# Patient Record
Sex: Male | Born: 1980 | Race: White | Hispanic: No | Marital: Married | Smoking: Never smoker
Health system: Southern US, Community
[De-identification: ages and names within clinical notes are randomized; demographics above are authoritative.]

---

## 2014-04-20 ENCOUNTER — Encounter (HOSPITAL_BASED_OUTPATIENT_CLINIC_OR_DEPARTMENT_OTHER): Payer: Self-pay | Admitting: Emergency Medicine

## 2014-04-20 ENCOUNTER — Emergency Department (HOSPITAL_BASED_OUTPATIENT_CLINIC_OR_DEPARTMENT_OTHER)
Admission: EM | Admit: 2014-04-20 | Discharge: 2014-04-20 | Disposition: A | Discharge status: Home / Self Care | Payer: BC Managed Care – PPO | Attending: Emergency Medicine | Admitting: Emergency Medicine

## 2014-04-20 ENCOUNTER — Emergency Department (HOSPITAL_BASED_OUTPATIENT_CLINIC_OR_DEPARTMENT_OTHER): Payer: BC Managed Care – PPO

## 2014-04-20 DIAGNOSIS — Z880 Allergy status to penicillin: Secondary | ICD-10-CM | POA: Insufficient documentation

## 2014-04-20 DIAGNOSIS — R61 Generalized hyperhidrosis: Secondary | ICD-10-CM | POA: Diagnosis not present

## 2014-04-20 DIAGNOSIS — R109 Unspecified abdominal pain: Secondary | ICD-10-CM | POA: Diagnosis present

## 2014-04-20 DIAGNOSIS — N200 Calculus of kidney: Secondary | ICD-10-CM | POA: Diagnosis not present

## 2014-04-20 LAB — URINALYSIS, ROUTINE W REFLEX MICROSCOPIC
Bilirubin Urine: NEGATIVE
Glucose, UA: NEGATIVE mg/dL
KETONES UR: NEGATIVE mg/dL
Leukocytes, UA: NEGATIVE
Nitrite: NEGATIVE
Protein, ur: NEGATIVE mg/dL
SPECIFIC GRAVITY, URINE: 1.02 (ref 1.005–1.030)
UROBILINOGEN UA: 0.2 mg/dL (ref 0.0–1.0)
pH: 5.5 (ref 5.0–8.0)

## 2014-04-20 LAB — COMPREHENSIVE METABOLIC PANEL
ALK PHOS: 51 U/L (ref 39–117)
ALT: 14 U/L (ref 0–53)
AST: 16 U/L (ref 0–37)
Albumin: 4.5 g/dL (ref 3.5–5.2)
Anion gap: 13 (ref 5–15)
BILIRUBIN TOTAL: 0.5 mg/dL (ref 0.3–1.2)
BUN: 14 mg/dL (ref 6–23)
CHLORIDE: 100 meq/L (ref 96–112)
CO2: 25 meq/L (ref 19–32)
Calcium: 10.1 mg/dL (ref 8.4–10.5)
Creatinine, Ser: 1 mg/dL (ref 0.50–1.35)
GFR calc non Af Amer: >90 mL/min (ref >90)
GLUCOSE: 106 mg/dL — AB (ref 70–99)
POTASSIUM: 4 meq/L (ref 3.7–5.3)
Sodium: 138 mEq/L (ref 137–147)
Total Protein: 7.6 g/dL (ref 6.0–8.3)

## 2014-04-20 LAB — URINE MICROSCOPIC-ADD ON

## 2014-04-20 LAB — CBC WITH DIFFERENTIAL/PLATELET
Basophils Absolute: 0 10*3/uL (ref 0.0–0.1)
Basophils Relative: 0 % (ref 0–1)
Eosinophils Absolute: 0 10*3/uL (ref 0.0–0.7)
Eosinophils Relative: 0 % (ref 0–5)
HCT: 36.8 % — ABNORMAL LOW (ref 39.0–52.0)
HEMOGLOBIN: 12.8 g/dL — AB (ref 13.0–17.0)
LYMPHS ABS: 1.7 10*3/uL (ref 0.7–4.0)
Lymphocytes Relative: 19 % (ref 12–46)
MCH: 31.3 pg (ref 26.0–34.0)
MCHC: 34.8 g/dL (ref 30.0–36.0)
MCV: 90 fL (ref 78.0–100.0)
MONOS PCT: 5 % (ref 3–12)
Monocytes Absolute: 0.5 10*3/uL (ref 0.1–1.0)
NEUTROS ABS: 6.7 10*3/uL (ref 1.7–7.7)
Neutrophils Relative %: 75 % (ref 43–77)
Platelets: 277 10*3/uL (ref 150–400)
RBC: 4.09 MIL/uL — AB (ref 4.22–5.81)
RDW: 12.9 % (ref 11.5–15.5)
WBC: 9 10*3/uL (ref 4.0–10.5)

## 2014-04-20 LAB — LIPASE, BLOOD: Lipase: 31 U/L (ref 11–59)

## 2014-04-20 MED ORDER — HYDROMORPHONE HCL PF 1 MG/ML IJ SOLN
0.5000 mg | Freq: Once | INTRAMUSCULAR | Status: AC
  Administered 2014-04-20: 0.5 mg via INTRAVENOUS
  Filled 2014-04-20: qty 1

## 2014-04-20 MED ORDER — SODIUM CHLORIDE 0.9 % IV SOLN
1000.0000 mL | INTRAVENOUS | Status: DC
  Administered 2014-04-20: 1000 mL via INTRAVENOUS

## 2014-04-20 MED ORDER — HYDROCODONE-ACETAMINOPHEN 5-325 MG PO TABS: 1.0000 | ORAL_TABLET | Freq: Four times a day (QID) | ORAL | Status: DC | PRN

## 2014-04-20 MED ORDER — TAMSULOSIN HCL 0.4 MG PO CAPS: 0.4000 mg | ORAL_CAPSULE | Freq: Every day | ORAL | Status: DC

## 2014-04-20 NOTE — ED Provider Notes (Signed)
CSN: [id number]     Arrival date & time 04/20/14  1555 History  This chart was scribed for Gerhard Munch, MD by Chestine Spore, ED Scribe. The patient was seen in room MH02/MH02 at 4:44 PM.     Chief Complaint  Patient presents with  . Abdominal Pain     The history is provided by the patient. No language interpreter was used.   HPI Comments: Hayden Sexton is a 33 y.o. male who presents to the Emergency Department complaining of abdominal pain onset today.  He states that the pain is in his RLQ and radiates to his back.He states that it was constant pressure pain always in the same area. He states that it started as a gas bubble that was not relieved with a BM. He states that PTA the pain had subsided. He states that while on the way to the ED he was breaking out in a sweat because of the pain. He states that it feels like he has to use the bathroom, but he knows that he doesn't. He states that he is having associated symptoms of diaphoresis, urgency. He states that he has tried Motrin with no relief for his symptoms. He denies vomiting, diarrhea, dysuria, nausea. He states that he is generally healthy. He states that he is allergic to Penicillin. He denies ever having a kidney stone.   History reviewed. No pertinent past medical history. History reviewed. No pertinent past surgical history. No family history on file. History  Substance Use Topics  . Smoking status: Never Smoker   . Smokeless tobacco: Not on file  . Alcohol Use: No    Review of Systems  Constitutional: Positive for diaphoresis.       Per HPI, otherwise negative  HENT:       Per HPI, otherwise negative  Respiratory:       Per HPI, otherwise negative  Cardiovascular:       Per HPI, otherwise negative  Gastrointestinal: Positive for abdominal pain. Negative for nausea, vomiting and diarrhea.  Endocrine:       Negative aside from HPI  Genitourinary: Positive for urgency. Negative for dysuria.       Neg aside from  HPI   Musculoskeletal:       Per HPI, otherwise negative  Skin: Negative.   Neurological: Negative for syncope.      Allergies  Penicillins  Home Medications   Prior to Admission medications   Not on File   BP 136/78  Pulse 63  Temp(Src) 97.8 F (36.6 C) (Oral)  Resp 18  Ht 6' (1.829 m)  Wt 180 lb (81.647 kg)  BMI 24.41 kg/m2  SpO2 100%  Physical Exam  Nursing note and vitals reviewed. Constitutional: He is oriented to person, place, and time. He appears well-developed. No distress.  HENT:  Head: Normocephalic and atraumatic.  Eyes: Conjunctivae and EOM are normal.  Cardiovascular: Normal rate, regular rhythm and normal heart sounds.   Pulmonary/Chest: Effort normal and breath sounds normal. No stridor. No respiratory distress.  Abdominal: Soft. He exhibits no distension.  Pain was in the RLQ but is not present upon exam. Mid discomfort.   Musculoskeletal: He exhibits no edema.  Neurological: He is alert and oriented to person, place, and time.  Skin: Skin is warm and dry.  Psychiatric: He has a normal mood and affect.    ED Course  Procedures (including critical care time) DIAGNOSTIC STUDIES: Oxygen Saturation is 100% on room air, normal by my interpretation.  COORDINATION OF CARE: 4:48 PM-Discussed treatment plan which includes UA, CT scan of abdomen pelvis, and Dilaudid with pt at bedside and pt agreed to plan.   Labs Review Labs Reviewed  URINALYSIS, ROUTINE W REFLEX MICROSCOPIC - Abnormal; Notable for the following:    Hgb urine dipstick MODERATE (*)    All other components within normal limits  CBC WITH DIFFERENTIAL - Abnormal; Notable for the following:    RBC 4.09 (*)    Hemoglobin 12.8 (*)    HCT 36.8 (*)    All other components within normal limits  COMPREHENSIVE METABOLIC PANEL - Abnormal; Notable for the following:    Glucose, Bld 106 (*)    All other components within normal limits  URINE MICROSCOPIC-ADD ON  LIPASE, BLOOD    Imaging  Review Ct Renal Stone Study  04/20/2014   CLINICAL DATA:  Pain and dysuria  EXAM: CT ABDOMEN AND PELVIS  TECHNIQUE: Multidetector CT imaging of the abdomen and pelvis was performed following the standard protocol without oral or intravenous contrast material administration.  COMPARISON:  None.  FINDINGS: Lung bases are clear.  No focal liver lesions are identified. There is no appreciable gallbladder wall thickening. There is no biliary duct dilatation.  Spleen, pancreas, and adrenals appear normal.  There is minimal fullness of the right renal collecting system. There is no similar fullness on the left. There is no renal mass or calculus on either side. There is no ureteral calculus or ureterectasis on either side. There is, however, a 1 mm calculus in the posterior urinary bladder just to the right of midline.  In the pelvis, the urinary bladder is largely decompressed. There is no pelvic mass or fluid collection. There are multiple phleboliths in the pelvis. There is fat in the right inguinal ring.  There is no bowel obstruction. No free air or portal venous air. Appendix is measures 7 mm in thickness, upper normal. There is no surrounding appendiceal inflammation or wall thickening. There is no periappendiceal region abscess or surrounding mesenteric stranding.  There is no ascites, adenopathy, or abscess in the abdomen or pelvis. There is no evidence abdominal aortic aneurysm. There are no blastic or lytic bone lesions.  IMPRESSION: Minimal fullness of the right renal collecting system. No renal or ureteral calculi are identified. There is, however, a 1 mm calculus just to the right of midline in the posterior urinary bladder. Suspect recent calculus passage on the right.  Appendix is upper1 normal in size at 7 mm diameter. Appendix otherwise appears normal.  No bowel obstruction.  No abscess.   Electronically Signed   By: Bretta BangWilliam  Woodruff M.D.   On: 04/20/2014 17:55    On repeat examination is in no  distress.  Discussed all findings, return precautions, follow up instructions.  MDM   Final diagnoses:  Kidney stone    Patient presents with right-sided abdominal pain, concern for a appendicitis versus kidney stone. Patient's pain resolved her.  Patient's CT scan demonstrates kidney stone.  Patient remained hemodynamically stable, in no distress, was discharged in stable condition to followup in neurology.  I personally performed the services described in this documentation, which was scribed in my presence. The recorded information has been reviewed and is accurate.    Gerhard Munchobert Yenesis Even, MD 04/20/14 [id number]

## 2014-04-20 NOTE — ED Notes (Signed)
Pt states he had a sharp pain to RLQ. Now he feels like he has pressure to urinate.

## 2014-04-20 NOTE — ED Notes (Signed)
PT discharged to home with family. NAD. Wife driving.

## 2014-04-20 NOTE — Discharge Instructions (Signed)
As discussed, today's evaluation has demonstrated the presence of a kidney stone.  This likely cause your abdominal pain.  Please be sure follow up with our urologists for further evaluation and management.  If you develop new, or concerning changes in your condition, please be sure to return here for further evaluation and management.

## 2014-04-20 NOTE — ED Notes (Signed)
Pt c/o RLQ radiating to back. Urgency no dysuria. Denies nausea and vomiting.

## 2016-01-03 IMAGING — CT CT RENAL STONE PROTOCOL
2 of 4 series · 16 of 46 positions shown, 18 images · non-contrast
Comparison: None.

CLINICAL DATA: Pain and dysuria

EXAM:
CT ABDOMEN AND PELVIS
TECHNIQUE: Multidetector CT imaging of the abdomen and pelvis was performed
following the standard protocol without oral or intravenous contrast
material administration.

[Series 2: renal stone < 200 lbs 5.0 b31f · axial · 0.79mm/px · z∈[-350,+100]mm · 13 of 98 slices shown, 15 images]
[im 4/98  soft-tissue]
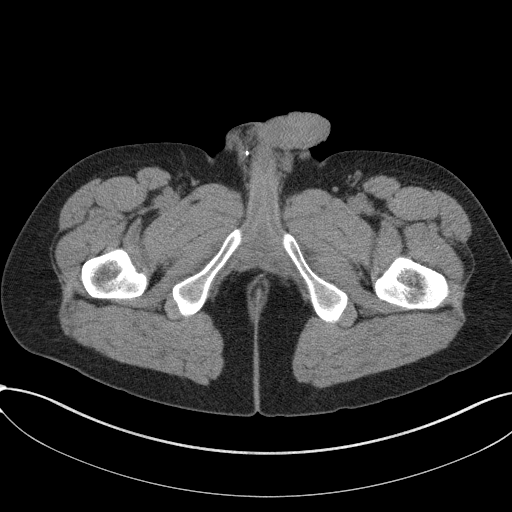
[im 4/98  bone]
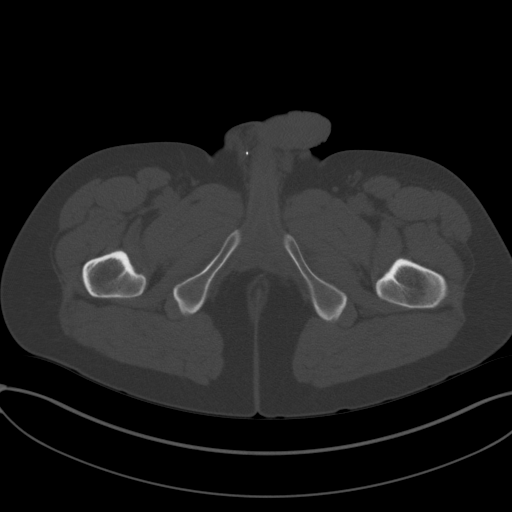
[im 12/98  soft-tissue]
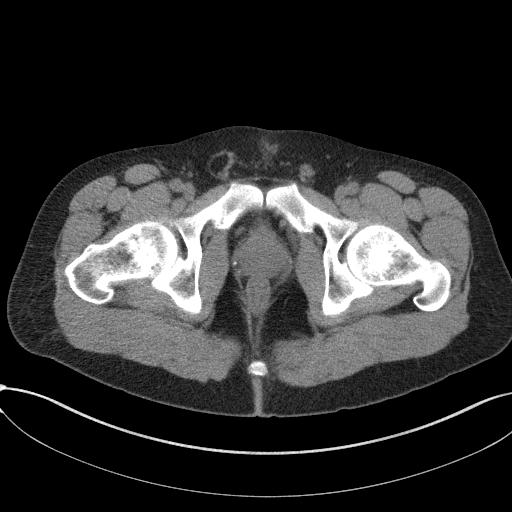
[im 20/98  soft-tissue]
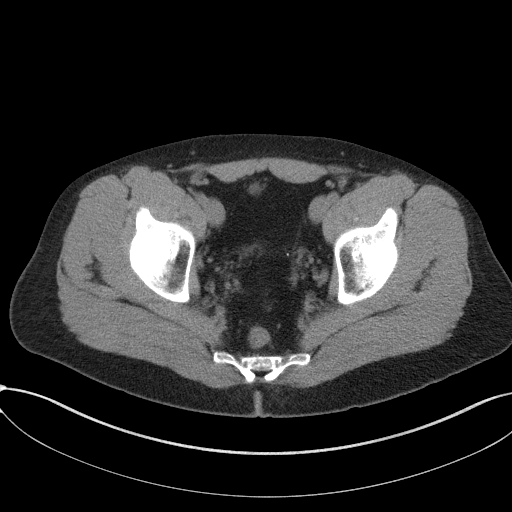
[im 28/98  soft-tissue]
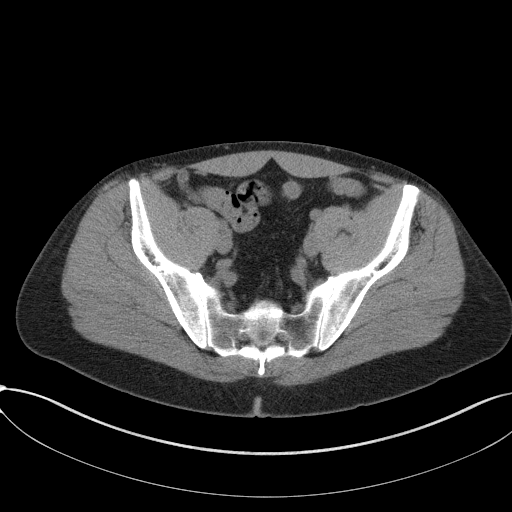
[im 35/98  soft-tissue]
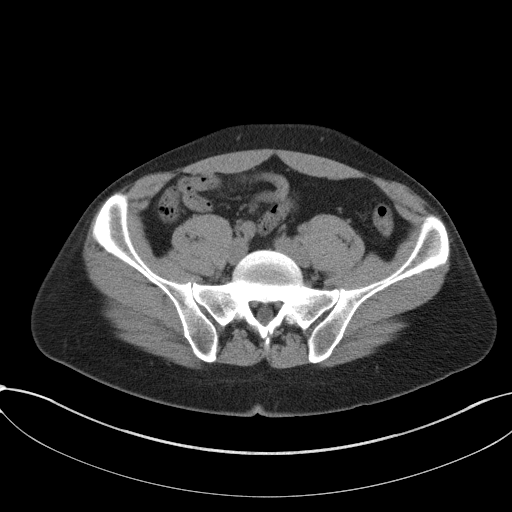
[im 43/98  soft-tissue]
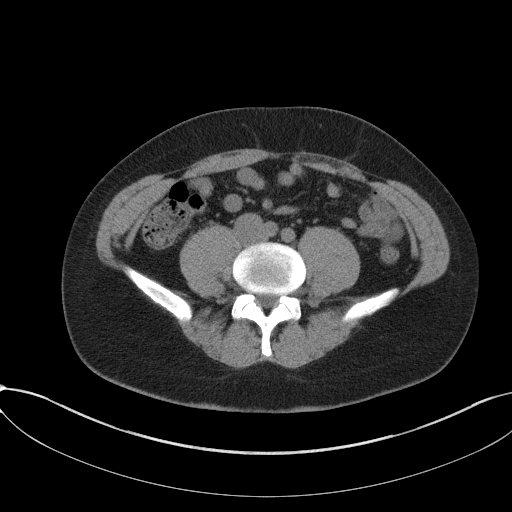
[im 51/98  soft-tissue]
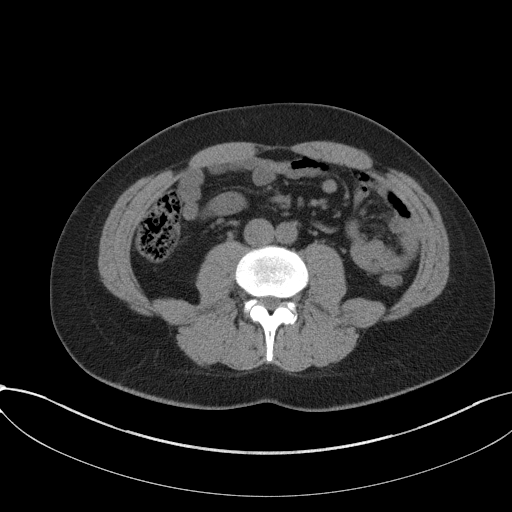
[im 55/98  soft-tissue]
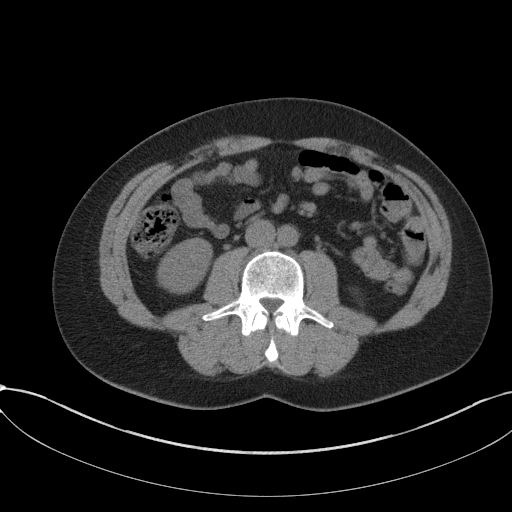
[im 63/98  soft-tissue]
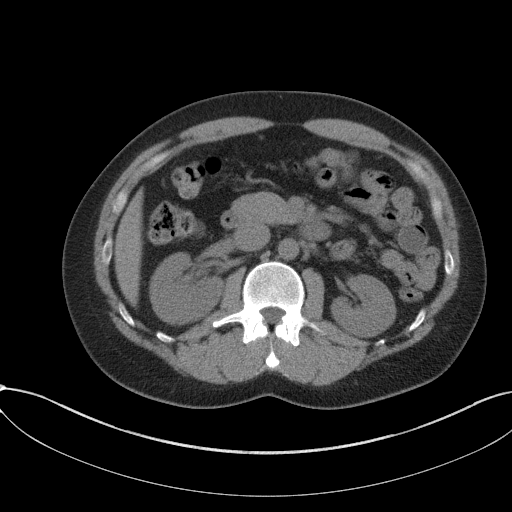
[im 63/98  bone]
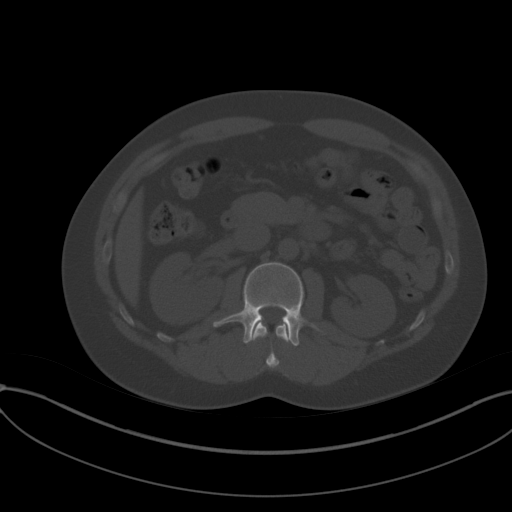
[im 70/98  soft-tissue]
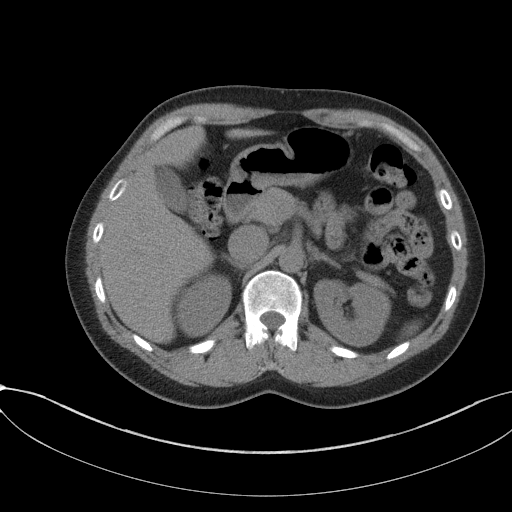
[im 78/98  soft-tissue]
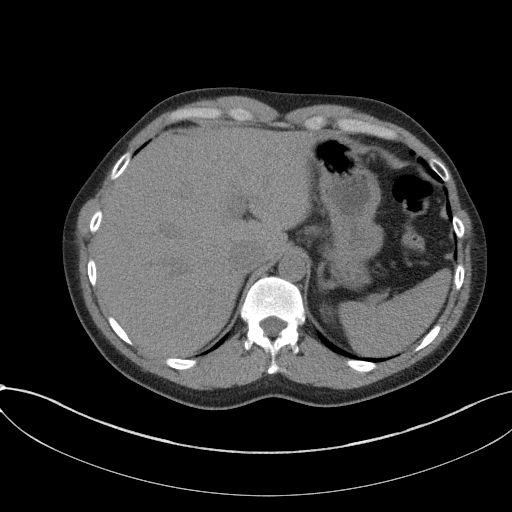
[im 86/98  soft-tissue]
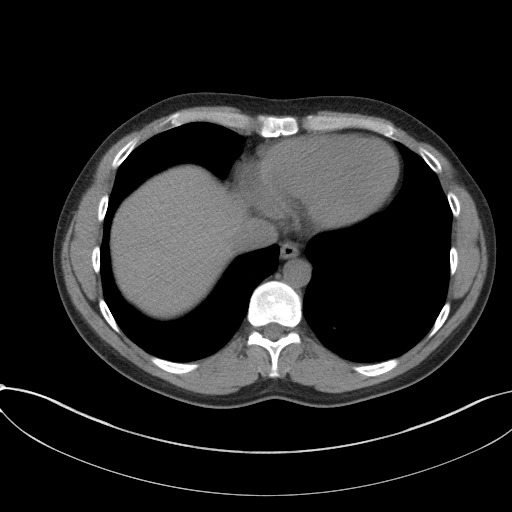
[im 94/98  soft-tissue]
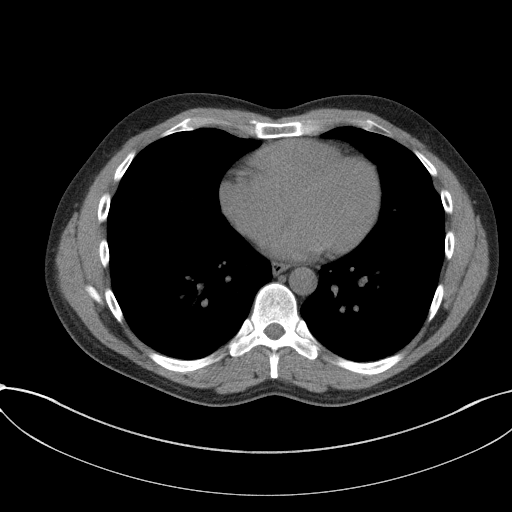

[Series 5: renal stone 3.0 coronal · coronal · 0.93mm/px · 3 of 83 slices shown]
[im 28/83  soft-tissue]
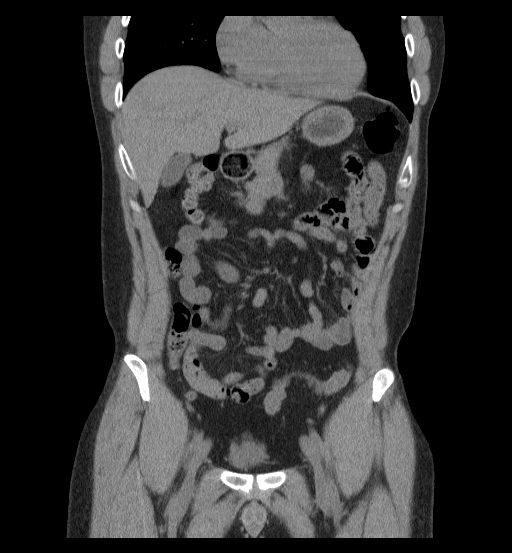
[im 37/83  soft-tissue]
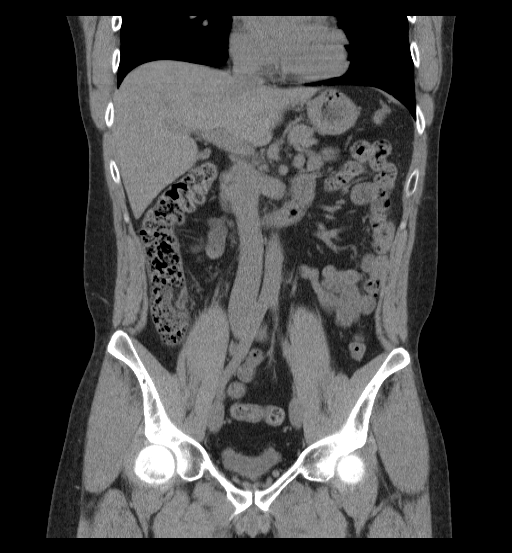
[im 46/83  soft-tissue]
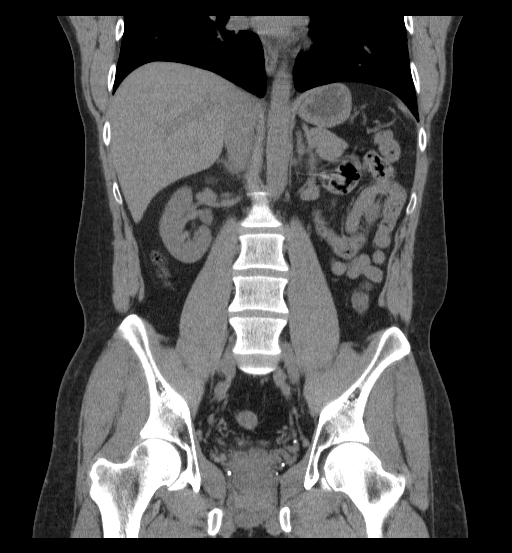

[16 of 46 positions shown; findings below may reference images not displayed]

FINDINGS: Lung bases are clear.

No focal liver lesions are identified. There is no appreciable
gallbladder wall thickening. There is no biliary duct dilatation.

Spleen, pancreas, and adrenals appear normal.

There is minimal fullness of the right renal collecting system.
There is no similar fullness on the left. There is no renal mass or
calculus on either side. There is no ureteral calculus or
ureterectasis on either side. There is, however, a 1 mm calculus in
the posterior urinary bladder just to the right of midline.

In the pelvis, the urinary bladder is largely decompressed. There is
no pelvic mass or fluid collection. There are multiple phleboliths
in the pelvis. There is fat in the right inguinal ring.

There is no bowel obstruction. No free air or portal venous air.
Appendix is measures 7 mm in thickness, upper normal. There is no
surrounding appendiceal inflammation or wall thickening. There is no
periappendiceal region abscess or surrounding mesenteric stranding.

There is no ascites, adenopathy, or abscess in the abdomen or
pelvis. There is no evidence abdominal aortic aneurysm. There are no
blastic or lytic bone lesions.
IMPRESSION: Minimal fullness of the right renal collecting system. No renal or
ureteral calculi are identified. There is, however, a 1 mm calculus
just to the right of midline in the posterior urinary bladder.
Suspect recent calculus passage on the right.

Appendix is upper1 normal in size at 7 mm diameter. Appendix
otherwise appears normal.

No bowel obstruction.  No abscess.

## 2018-09-23 ENCOUNTER — Emergency Department (HOSPITAL_BASED_OUTPATIENT_CLINIC_OR_DEPARTMENT_OTHER)
Admission: EM | Admit: 2018-09-23 | Discharge: 2018-09-23 | Disposition: A | Discharge status: Home / Self Care | Payer: BC Managed Care – PPO | Attending: Emergency Medicine | Admitting: Emergency Medicine

## 2018-09-23 ENCOUNTER — Encounter (HOSPITAL_BASED_OUTPATIENT_CLINIC_OR_DEPARTMENT_OTHER): Payer: Self-pay | Admitting: Emergency Medicine

## 2018-09-23 ENCOUNTER — Other Ambulatory Visit: Payer: Self-pay

## 2018-09-23 DIAGNOSIS — X58XXXA Exposure to other specified factors, initial encounter: Secondary | ICD-10-CM | POA: Insufficient documentation

## 2018-09-23 DIAGNOSIS — T1501XA Foreign body in cornea, right eye, initial encounter: Secondary | ICD-10-CM | POA: Diagnosis not present

## 2018-09-23 DIAGNOSIS — Y9389 Activity, other specified: Secondary | ICD-10-CM | POA: Insufficient documentation

## 2018-09-23 DIAGNOSIS — Y999 Unspecified external cause status: Secondary | ICD-10-CM | POA: Insufficient documentation

## 2018-09-23 DIAGNOSIS — Y929 Unspecified place or not applicable: Secondary | ICD-10-CM | POA: Insufficient documentation

## 2018-09-23 DIAGNOSIS — S0591XA Unspecified injury of right eye and orbit, initial encounter: Secondary | ICD-10-CM | POA: Diagnosis present

## 2018-09-23 DIAGNOSIS — H18891 Other specified disorders of cornea, right eye: Secondary | ICD-10-CM | POA: Insufficient documentation

## 2018-09-23 MED ORDER — FLUORESCEIN SODIUM 1 MG OP STRP
ORAL_STRIP | OPHTHALMIC | Status: AC
  Filled 2018-09-23: qty 1

## 2018-09-23 MED ORDER — ERYTHROMYCIN 5 MG/GM OP OINT: TOPICAL_OINTMENT | OPHTHALMIC | 0 refills | Status: AC

## 2018-09-23 MED ORDER — ERYTHROMYCIN 5 MG/GM OP OINT
TOPICAL_OINTMENT | Freq: Once | OPHTHALMIC | Status: AC
Start: 2018-09-23 — End: 2018-09-23
  Administered 2018-09-23: 03:00:00 via OPHTHALMIC
  Filled 2018-09-23: qty 3.5

## 2018-09-23 MED ORDER — TETRACAINE HCL 0.5 % OP SOLN
2.0000 [drp] | Freq: Once | OPHTHALMIC | Status: DC
  Filled 2018-09-23: qty 4

## 2018-09-23 NOTE — ED Notes (Signed)
PT states understanding of care given, follow up care, and medication prescribed. PT ambulated from ED to car with a steady gait. 

## 2018-09-23 NOTE — ED Triage Notes (Signed)
Pt states that earlier today a piece of metal flew in his eye while at home. Pt states that he was wearing protective glasses. States he has tried washing his eye out without success. Vision is intact. Tetanus within 5 years

## 2018-09-23 NOTE — ED Provider Notes (Signed)
[facility] EMERGENCY DEPARTMENT Provider Note   CSN: [id number] Arrival date & time: 09/23/18  0137     History   Chief Complaint Chief Complaint  Patient presents with  . Eye Pain    HPI Hayden Sexton is a 38 y.o. male.  The history is provided by the patient.  He was working on a car, doing some grinding, when piece of metal got in his right eye.  He tried to flush it out and try to use a Q-tip to get it out and has been unsuccessful.  He denies other injury.  Last tetanus immunization was within the last 5 years.  History reviewed. No pertinent past medical history.  There are no active problems to display for this patient.   History reviewed. No pertinent surgical history.      Home Medications    Prior to Admission medications   Medication Sig Start Date End Date Taking? Authorizing Provider  erythromycin ophthalmic ointment Place a 1/2 inch ribbon of ointment into the lower eyelid. 09/23/18   Dione Booze, MD    Family History No family history on file.  Social History Social History   Tobacco Use  . Smoking status: Never Smoker  Substance Use Topics  . Alcohol use: No  . Drug use: No     Allergies   Cephalosporins and Penicillins   Review of Systems Review of Systems  All other systems reviewed and are negative.    Physical Exam Updated Vital Signs BP (!) 146/92   Pulse 65   Temp 97.9 F (36.6 C)   Resp 18   Ht 6\' 1"  (1.854 m)   Wt 88.5 kg   SpO2 100%   BMI 25.73 kg/m   Physical Exam Vitals signs and nursing note reviewed.    38 year old male, resting comfortably and in no acute distress. Vital signs are significant for elevated blood pressure. Oxygen saturation is 100%, which is normal. Head is normocephalic and atraumatic. PERRLA, EOMI. There is moderate injection of the conjunctiva of the right eye.  Foreign body is seen in the cornea of the right eye at the 4 o'clock position, about midway between central cornea  and limbus.  Anterior chamber is clear without cells or ciliary flare.  Oropharynx is clear. Neck is nontender and supple without adenopathy or JVD. Back is nontender and there is no CVA tenderness. Lungs are clear without rales, wheezes, or rhonchi. Chest is nontender. Heart has regular rate and rhythm without murmur. Abdomen is soft, flat, nontender without masses or hepatosplenomegaly and peristalsis is normoactive. Extremities have no cyanosis or edema, full range of motion is present. Skin is warm and dry without rash. Neurologic: Mental status is normal, cranial nerves are intact, there are no motor or sensory deficits.  ED Treatments / Results   Procedures .Foreign Body Removal Date/Time: 09/23/2018 2:00 AM Performed by: Dione Booze, MD Authorized by: Dione Booze, MD  Consent: Verbal consent obtained. Written consent not obtained. Risks and benefits: risks, benefits and alternatives were discussed Consent given by: patient Patient understanding: patient states understanding of the procedure being performed Patient consent: the patient's understanding of the procedure matches consent given Procedure consent: procedure consent matches procedure scheduled Relevant documents: relevant documents present and verified Site marked: the operative site was marked Required items: required blood products, implants, devices, and special equipment available Patient identity confirmed: verbally with patient and arm band Time out: Immediately prior to procedure a "time out" was called to verify the  correct patient, procedure, equipment, support staff and site/side marked as required. Body area: eye Location details: right cornea Anesthesia method: Topical.  Anesthesia: Local Anesthetic: tetracaine drops  Sedation: Patient sedated: no  Patient restrained: no Patient cooperative: yes Localization method: slit lamp Removal mechanism: 25-gauge needle Eye not examined with  fluorescein. Residual rust ring present. Dressing: antibiotic ointment Depth: embedded Complexity: simple 1 objects recovered. Objects recovered: Piece of metal Post-procedure assessment: foreign body removed Patient tolerance: Patient tolerated the procedure well with no immediate complications     Medications Ordered in ED Medications  tetracaine (PONTOCAINE) 0.5 % ophthalmic solution 2 drop (has no administration in time range)  erythromycin ophthalmic ointment (has no administration in time range)     Initial Impression / Assessment and Plan / ED Course  I have reviewed the triage vital signs and the nursing notes.  Foreign body cornea of the right eye.  Slit-lamp examination was done with results as noted above.  Under slit lamp guidance, foreign body was removed with the residual rust noted.  He is given a prescription for erythromycin ophthalmic ointment and referred to ophthalmology for removal of residual rust.  He is to use over-the-counter analgesics as needed for pain.  Old records are reviewed, and he has no relevant past visits.  Final Clinical Impressions(s) / ED Diagnoses   Final diagnoses:  Corneal foreign body, right, initial encounter  Corneal rust ring of right eye    ED Discharge Orders         Ordered    erythromycin ophthalmic ointment     09/23/18 0222           Dione Booze, MD 09/23/18 [phone]

## 2018-09-23 NOTE — Discharge Instructions (Signed)
Take ibuprofen and/or acetaminophen as needed for pain.  See the ophthalmologist to remove the rust ring.
